# Patient Record
Sex: Male | Born: 1985 | Race: White | Hispanic: No | Marital: Married | Smoking: Never smoker
Health system: Southern US, Community
[De-identification: ages and names within clinical notes are randomized; demographics above are authoritative.]

## PROBLEM LIST (undated history)

## (undated) DIAGNOSIS — N200 Calculus of kidney: Secondary | ICD-10-CM

## (undated) HISTORY — PX: OTHER SURGICAL HISTORY: SHX169

## (undated) HISTORY — DX: Calculus of kidney: N20.0

---
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
## 2006-02-05 ENCOUNTER — Emergency Department (HOSPITAL_COMMUNITY): Admission: EM | Admit: 2006-02-05 | Discharge: 2006-02-05 | Payer: Self-pay | Admitting: Emergency Medicine

## 2012-08-23 ENCOUNTER — Emergency Department (HOSPITAL_COMMUNITY): Payer: BC Managed Care – PPO

## 2012-08-23 ENCOUNTER — Emergency Department (HOSPITAL_COMMUNITY)
Admission: EM | Admit: 2012-08-23 | Discharge: 2012-08-23 | Disposition: A | Discharge status: Home / Self Care | Payer: BC Managed Care – PPO | Attending: Emergency Medicine | Admitting: Emergency Medicine

## 2012-08-23 ENCOUNTER — Encounter (HOSPITAL_COMMUNITY): Payer: Self-pay | Admitting: Emergency Medicine

## 2012-08-23 DIAGNOSIS — N23 Unspecified renal colic: Secondary | ICD-10-CM

## 2012-08-23 LAB — CBC WITH DIFFERENTIAL/PLATELET
Basophils Absolute: 0 10*3/uL (ref 0.0–0.1)
Basophils Relative: 1 % (ref 0–1)
Eosinophils Absolute: 0.1 10*3/uL (ref 0.0–0.7)
Eosinophils Relative: 3 % (ref 0–5)
HCT: 43.4 % (ref 39.0–52.0)
Hemoglobin: 15.4 g/dL (ref 13.0–17.0)
Lymphocytes Relative: 35 % (ref 12–46)
Lymphs Abs: 1.6 10*3/uL (ref 0.7–4.0)
MCH: 30.9 pg (ref 26.0–34.0)
MCHC: 35.5 g/dL (ref 30.0–36.0)
MCV: 87 fL (ref 78.0–100.0)
Monocytes Absolute: 0.4 10*3/uL (ref 0.1–1.0)
Monocytes Relative: 8 % (ref 3–12)
Neutro Abs: 2.4 10*3/uL (ref 1.7–7.7)
Neutrophils Relative %: 53 % (ref 43–77)
Platelets: 125 10*3/uL — ABNORMAL LOW (ref 150–400)
RBC: 4.99 MIL/uL (ref 4.22–5.81)
RDW: 12.5 % (ref 11.5–15.5)
WBC: 4.4 10*3/uL (ref 4.0–10.5)

## 2012-08-23 LAB — URINALYSIS, ROUTINE W REFLEX MICROSCOPIC
Bilirubin Urine: NEGATIVE
Glucose, UA: NEGATIVE mg/dL
Ketones, ur: NEGATIVE mg/dL
Leukocytes, UA: NEGATIVE
Nitrite: NEGATIVE
Protein, ur: NEGATIVE mg/dL
Specific Gravity, Urine: 1.012 (ref 1.005–1.030)
Urobilinogen, UA: 0.2 mg/dL (ref 0.0–1.0)
pH: 8 (ref 5.0–8.0)

## 2012-08-23 LAB — BASIC METABOLIC PANEL
BUN: 11 mg/dL (ref 6–23)
CO2: 30 mEq/L (ref 19–32)
Calcium: 9.7 mg/dL (ref 8.4–10.5)
Chloride: 103 mEq/L (ref 96–112)
Creatinine, Ser: 0.94 mg/dL (ref 0.50–1.35)
GFR calc Af Amer: >90 mL/min (ref >90)
GFR calc non Af Amer: >90 mL/min (ref >90)
Glucose, Bld: 96 mg/dL (ref 70–99)
Potassium: 3.9 mEq/L (ref 3.5–5.1)
Sodium: 141 mEq/L (ref 135–145)

## 2012-08-23 LAB — URINE MICROSCOPIC-ADD ON

## 2012-08-23 MED ORDER — HYDROMORPHONE HCL PF 1 MG/ML IJ SOLN
1.0000 mg | Freq: Once | INTRAMUSCULAR | Status: AC
  Administered 2012-08-23: 1 mg via INTRAMUSCULAR
  Filled 2012-08-23: qty 1

## 2012-08-23 NOTE — ED Notes (Signed)
Pt to CT

## 2012-08-23 NOTE — ED Notes (Signed)
NAD noted at time of d/c home with mother 

## 2012-08-23 NOTE — ED Provider Notes (Signed)
History     CSN: [id number]  Arrival date & time 08/23/12  0920   First MD Initiated Contact with Patient 08/23/12 1042      Chief Complaint  Patient presents with  . Abdominal Pain    (Consider location/radiation/quality/duration/timing/severity/associated sxs/prior treatment) Patient is a 26 y.o. male presenting with abdominal pain. The history is provided by the patient.  Abdominal Pain The primary symptoms of the illness include abdominal pain. The primary symptoms of the illness do not include fever or shortness of breath.  Additional symptoms associated with the illness include back pain. Symptoms associated with the illness do not include hematuria.  pt c/o acute onset right flank pain posteriorly from sleep while resting this morning. Acute, sudden severe pain, radiated to rlq. Constant but waxes/wanes in intensity. Nausea. No vomiting. Having normal bms. No hx same pain. No back injury or strain. No hx kidney stones. No dysuria or hematuria. No scrotal or testicular pain or swelling. No fever or chills.   History reviewed. No pertinent past medical history.  History reviewed. No pertinent past surgical history.  History reviewed. No pertinent family history.  History  Substance Use Topics  . Smoking status: Never Smoker   . Smokeless tobacco: Not on file  . Alcohol Use: Yes     occ      Review of Systems  Constitutional: Negative for fever.  HENT: Negative for neck pain.   Respiratory: Negative for cough and shortness of breath.   Cardiovascular: Negative for chest pain.  Gastrointestinal: Positive for abdominal pain.  Genitourinary: Positive for flank pain. Negative for hematuria.  Musculoskeletal: Positive for back pain.  Skin: Negative for rash.  Neurological: Negative for headaches.  Hematological: Does not bruise/bleed easily.  Psychiatric/Behavioral: Negative for confusion.    Allergies  Review of patient's allergies indicates no known  allergies.  Home Medications  No current outpatient prescriptions on file.  BP 132/82  Pulse 88  Temp 97.8 F (36.6 C) (Oral)  Resp 20  SpO2 99%  Physical Exam  Nursing note and vitals reviewed. Constitutional: He is oriented to person, place, and time. He appears well-developed and well-nourished. No distress.  HENT:  Head: Atraumatic.  Eyes: Conjunctivae normal are normal. No scleral icterus.  Neck: Neck supple. No tracheal deviation present.  Cardiovascular: Normal rate, regular rhythm, normal heart sounds and intact distal pulses.   Pulmonary/Chest: Effort normal and breath sounds normal. No accessory muscle usage. No respiratory distress.  Abdominal: Soft. Bowel sounds are normal. He exhibits no distension and no mass. There is no tenderness. There is no rebound and no guarding.  Genitourinary:       No cva tenderness. No scrotal/testicular pain or tenderness  Musculoskeletal: Normal range of motion. He exhibits no edema and no tenderness.       tls spine nt.   Neurological: He is alert and oriented to person, place, and time.  Skin: Skin is warm and dry. No rash noted.       No rash/shingles in area of pain  Psychiatric: He has a normal mood and affect.    ED Course  Procedures (including critical care time)  Labs Reviewed  CBC WITH DIFFERENTIAL - Abnormal; Notable for the following:    Platelets 125 (*)     All other components within normal limits  BASIC METABOLIC PANEL  URINALYSIS, ROUTINE W REFLEX MICROSCOPIC   Results for orders placed during the hospital encounter of 08/23/12  CBC WITH DIFFERENTIAL      Component  Value Range   WBC 4.4  4.0 - 10.5 K/uL   RBC 4.99  4.22 - 5.81 MIL/uL   Hemoglobin 15.4  13.0 - 17.0 g/dL   HCT 16.1  09.6 - 04.5 %   MCV 87.0  78.0 - 100.0 fL   MCH 30.9  26.0 - 34.0 pg   MCHC 35.5  30.0 - 36.0 g/dL   RDW 40.9  81.1 - 91.4 %   Platelets 125 (*) 150 - 400 K/uL   Neutrophils Relative 53  43 - 77 %   Neutro Abs 2.4  1.7 - 7.7  K/uL   Lymphocytes Relative 35  12 - 46 %   Lymphs Abs 1.6  0.7 - 4.0 K/uL   Monocytes Relative 8  3 - 12 %   Monocytes Absolute 0.4  0.1 - 1.0 K/uL   Eosinophils Relative 3  0 - 5 %   Eosinophils Absolute 0.1  0.0 - 0.7 K/uL   Basophils Relative 1  0 - 1 %   Basophils Absolute 0.0  0.0 - 0.1 K/uL  BASIC METABOLIC PANEL      Component Value Range   Sodium 141  135 - 145 mEq/L   Potassium 3.9  3.5 - 5.1 mEq/L   Chloride 103  96 - 112 mEq/L   CO2 30  19 - 32 mEq/L   Glucose, Bld 96  70 - 99 mg/dL   BUN 11  6 - 23 mg/dL   Creatinine, Ser 7.82  0.50 - 1.35 mg/dL   Calcium 9.7  8.4 - 95.6 mg/dL   GFR calc non Af Amer >90  >90 mL/min   GFR calc Af Amer >90  >90 mL/min  URINALYSIS, ROUTINE W REFLEX MICROSCOPIC      Component Value Range   Color, Urine YELLOW  YELLOW   APPearance CLEAR  CLEAR   Specific Gravity, Urine 1.012  1.005 - 1.030   pH 8.0  5.0 - 8.0   Glucose, UA NEGATIVE  NEGATIVE mg/dL   Hgb urine dipstick MODERATE (*) NEGATIVE   Bilirubin Urine NEGATIVE  NEGATIVE   Ketones, ur NEGATIVE  NEGATIVE mg/dL   Protein, ur NEGATIVE  NEGATIVE mg/dL   Urobilinogen, UA 0.2  0.0 - 1.0 mg/dL   Nitrite NEGATIVE  NEGATIVE   Leukocytes, UA NEGATIVE  NEGATIVE  URINE MICROSCOPIC-ADD ON      Component Value Range   RBC / HPF 21-50  <3 RBC/hpf   Urine-Other AMORPHOUS URATES/PHOSPHATES     Ct Abdomen Pelvis Wo Contrast  08/23/2012  *RADIOLOGY REPORT*  Clinical Data: Right flank pain  CT ABDOMEN AND PELVIS WITHOUT CONTRAST  Technique:  Multidetector CT imaging of the abdomen and pelvis was performed following the standard protocol without intravenous contrast.  Comparison: None.  Findings: Lung bases are unremarkable. Sagittal images of the spine shows a Schmorl's node deformity superior endplate of the L4 and L5 vertebral body.  Unenhanced liver is unremarkable.  No calcified gallstones are noted within gallbladder.  Pancreas, spleen and adrenal glands are unremarkable.  Kidneys are  symmetrical in size.  No hydronephrosis. Minimal prominence of the right ureter without frank hydroureter. At least three nonobstructive punctate calcified calculi are noted within the right kidney the largest in mid pole measures 1.6 mm. Nonobstructive punctate calcified calculus in the lower pole of the left kidney measures 1.3 mm.  No calcified ureteral calculi are noted bilaterally.  Bilateral distal ureter is unremarkable.  Moderate stool noted in the right colon.  Stool noted  within cecum. There is a low-lying cecum with tip in the right anterior pelvis just above the urinary bladder.  The appendix is not identified.  In axial image 64 in midline posterior aspect of the urinary bladder there is a calcified calculus measures 3 mm.  This most likely represents a recent passed ureteral calculus.  Trace pelvic free fluid is noted in the right posterior cul-de-sac.  Prostate gland and seminal vesicles are unremarkable.  IMPRESSION:  1.  There is bilateral nonobstructive nephrolithiasis.  No hydronephrosis or hydroureter.  Minimal prominence of the right ureter without hydroureter. 2.  No calcified ureteral calculi are noted. 3.  There is a 3 mm calcified calculus midline posterior aspect of the urinary bladder probable a recent passed ureteral calculus. 4.  A low-lying cecum is noted with tip in the right anterior pelvis just above the urinary bladder.  Stool noted in the cecum and right colon.  The appendix is not identified. 5.  Trace pelvic free fluid.   Original Report Authenticated By: Natasha Mead, M.D.       MDM  Dilaudid im.   Labs. Ct.   Microscopic hemauria, ct scan c/w recent passed ureteral stone now in bladder, acute onset flank pain posterior - appears c/w ureteral colic. abd is soft nt. Pain resolved.           Suzi Roots, MD 08/23/12 1316

## 2012-08-23 NOTE — ED Notes (Signed)
Pt c/o RLQ pain that woke pt from sleep this am with nausea

## 2014-04-05 ENCOUNTER — Ambulatory Visit (INDEPENDENT_AMBULATORY_CARE_PROVIDER_SITE_OTHER): Payer: BC Managed Care – PPO | Admitting: Physician Assistant

## 2014-04-05 VITALS — BP 108/68 | HR 109 | Temp 97.7°F | Resp 18 | Ht 71.5 in | Wt 145.0 lb

## 2014-04-05 DIAGNOSIS — J020 Streptococcal pharyngitis: Secondary | ICD-10-CM

## 2014-04-05 DIAGNOSIS — J029 Acute pharyngitis, unspecified: Secondary | ICD-10-CM

## 2014-04-05 DIAGNOSIS — R509 Fever, unspecified: Secondary | ICD-10-CM

## 2014-04-05 LAB — POCT RAPID STREP A (OFFICE): Rapid Strep A Screen: POSITIVE — AB

## 2014-04-05 MED ORDER — AMOXICILLIN 875 MG PO TABS: 875.0000 mg | ORAL_TABLET | Freq: Two times a day (BID) | ORAL | Status: DC

## 2014-04-05 NOTE — Progress Notes (Signed)
   Subjective:    Patient ID: Craig Gutierrez, male    DOB: 10/30/1986, 28 y.o.   MRN: [id number]  HPI 28 year old male presents for evaluation of sore throat x 3 days.  States symptoms started suddenly and have continued to worsen. Admits to severe sore throat with painful swallowing. Slight PND and bilateral otalgia.  Had low grade fever of 100.0 yesterday that has improved today, but all other symptoms continue to worsen.  Has been able to swallow but it is painful.   Hx of strep throat as a child.  Works as an [employer] information [employer] manager where there have been several cases of strep over the past 1-2 weeks.  Patient is otherwise healthy with no other concerns today.     Review of Systems  Constitutional: Positive for fever and chills.  HENT: Positive for ear pain, postnasal drip and sore throat. Negative for congestion, rhinorrhea and sinus pressure.   Respiratory: Negative for cough.   Gastrointestinal: Negative for nausea and vomiting.  Neurological: Positive for headaches. Negative for dizziness.       Objective:   Physical Exam  Constitutional: He is oriented to person, place, and time. He appears well-developed and well-nourished.  HENT:  Head: Normocephalic and atraumatic.  Right Ear: Hearing, tympanic membrane, external ear and ear canal normal.  Left Ear: Hearing, tympanic membrane, external ear and ear canal normal.  Mouth/Throat: Uvula is midline and mucous membranes are normal. Posterior oropharyngeal erythema (bilateral 3+ tonsillar swelling) present. No oropharyngeal exudate, posterior oropharyngeal edema or tonsillar abscesses.  Eyes: Conjunctivae are normal.  Neck: Normal range of motion.  Cardiovascular: Normal rate, regular rhythm and normal heart sounds.   Pulmonary/Chest: Effort normal and breath sounds normal.  Neurological: He is alert and oriented to person, place, and time.  Psychiatric: He has a normal mood and affect. His behavior is normal. Judgment and thought  content normal.      Results for orders placed in visit on 04/05/14  POCT RAPID STREP A (OFFICE)      Result Value Ref Range   Rapid Strep A Screen Positive (*) Negative       Assessment & Plan:  Strep pharyngitis - Plan: amoxicillin (AMOXIL) 875 MG tablet  Acute pharyngitis - Plan: POCT rapid strep A  Fever, unspecified  Will treat with amoxicillin 875 mg bid x 10 days Out of work tomorrow.  Change toothbrush Recommend ibuprofen 600-800 mg tid prn pain, fever/chills RTC precautions discussed Recheck if symptoms worsen or fail to improve.

## 2017-11-30 ENCOUNTER — Other Ambulatory Visit: Payer: Self-pay

## 2017-11-30 ENCOUNTER — Encounter: Payer: Self-pay | Admitting: Emergency Medicine

## 2017-11-30 ENCOUNTER — Ambulatory Visit: Payer: BLUE CROSS/BLUE SHIELD | Admitting: Emergency Medicine

## 2017-11-30 VITALS — BP 102/64 | HR 89 | Temp 98.5°F | Resp 16 | Ht 71.25 in | Wt 157.4 lb

## 2017-11-30 DIAGNOSIS — R12 Heartburn: Secondary | ICD-10-CM | POA: Diagnosis not present

## 2017-11-30 DIAGNOSIS — R1013 Epigastric pain: Secondary | ICD-10-CM

## 2017-11-30 MED ORDER — RANITIDINE HCL 300 MG PO TABS: 300.0000 mg | ORAL_TABLET | Freq: Every day | ORAL | 1 refills | Status: DC

## 2017-11-30 MED ORDER — OMEPRAZOLE 40 MG PO CPDR: 40.0000 mg | DELAYED_RELEASE_CAPSULE | Freq: Every day | ORAL | 3 refills | Status: DC

## 2017-11-30 NOTE — Patient Instructions (Addendum)
IF you received an x-ray today, you will receive an invoice from [facility]. Please contact [facility] at [phone] with questions or concerns regarding your invoice.   IF you received labwork today, you will receive an invoice from [facility]. Please contact [facility] at [phone] with questions or concerns regarding your invoice.   Our billing staff will not be able to assist you with questions regarding bills from these companies.  You will be contacted with the lab results as soon as they are available. The fastest way to get your results is to activate your My Chart account. Instructions are located on the last page of this paperwork. If you have not heard from us regarding the results in 2 weeks, please contact this office.     Heartburn Heartburn is a type of pain or discomfort that can happen in the throat or chest. It is often described as a burning pain. It may also cause a bad taste in the mouth. Heartburn may feel worse when you lie down or bend over. It may be caused by stomach contents that move back up (reflux) into the tube that connects the mouth with the stomach (esophagus). Follow these instructions at home: Take these actions to lessen your discomfort and to help avoid problems. Diet  Follow a diet as told by your doctor. You may need to avoid foods and drinks such as: ? Coffee and tea (with or without caffeine). ? Drinks that contain alcohol. ? Energy drinks and sports drinks. ? Carbonated drinks or sodas. ? Chocolate and cocoa. ? Peppermint and mint flavorings. ? Garlic and onions. ? Horseradish. ? Spicy and acidic foods, such as peppers, chili powder, curry powder, vinegar, hot sauces, and BBQ sauce. ? Citrus fruit juices and citrus fruits, such as oranges, lemons, and limes. ? Tomato-based foods, such as red sauce, chili, salsa, and pizza with red sauce. ? Fried and fatty foods, such as donuts, french fries, potato chips, and high-fat  dressings. ? High-fat meats, such as hot dogs, rib eye steak, sausage, ham, and bacon. ? High-fat dairy items, such as whole milk, butter, and cream cheese.  Eat small meals often. Avoid eating large meals.  Avoid drinking large amounts of liquid with your meals.  Avoid eating meals during the 2-3 hours before bedtime.  Avoid lying down right after you eat.  Do not exercise right after you eat. General instructions  Pay attention to any changes in your symptoms.  Take over-the-counter and prescription medicines only as told by your doctor. Do not take aspirin, ibuprofen, or other NSAIDs unless your doctor says it is okay.  Do not use any tobacco products, including cigarettes, chewing tobacco, and e-cigarettes. If you need help quitting, ask your doctor.  Wear loose clothes. Do not wear anything tight around your waist.  Raise (elevate) the head of your bed about 6 inches (15 cm).  Try to lower your stress. If you need help doing this, ask your doctor.  If you are overweight, lose an amount of weight that is healthy for you. Ask your doctor about a safe weight loss goal.  Keep all follow-up visits as told by your doctor. This is important. Contact a doctor if:  You have new symptoms.  You lose weight and you do not know why it is happening.  You have trouble swallowing, or it hurts to swallow.  You have wheezing or a cough that keeps happening.  Your symptoms do not get better with treatment.  You have heartburn often for more than two weeks. Get help right away if:  You have pain in your arms, neck, jaw, teeth, or back.  You feel sweaty, dizzy, or light-headed.  You have chest pain or shortness of breath.  You throw up (vomit) and your throw up looks like blood or coffee grounds.  Your poop (stool) is bloody or black. This information is not intended to replace advice given to you by your health care provider. Make sure you discuss any questions you have with  your health care provider. Document Released: 07/04/2011 Document Revised: 03/29/2016 Document Reviewed: 02/16/2015 Elsevier Interactive Patient Education  [employer]2018 [employer]

## 2017-11-30 NOTE — Progress Notes (Signed)
Craig Gutierrez 32 y.o.   Chief Complaint  Patient presents with  . Establish Care  . GI Problem    x 2 months - per patient having stomach cramps    HISTORY OF PRESENT ILLNESS: This is a 32 y.o. male complaining of epigastric pain that started 2 months; burning pain that starts in early evening and lasts until bedtime; tried modifying diet without success; gets the pain 4-5 days/week; no associated symptoms.   Abdominal Pain  This is a new problem. The current episode started more than 1 month ago. The onset quality is gradual. The problem occurs intermittently. The problem has been waxing and waning. The pain is located in the epigastric region. The pain is at a severity of 5/10. The pain is moderate. The quality of the pain is burning. The abdominal pain does not radiate. Pertinent negatives include no anorexia, belching, constipation, diarrhea, dysuria, fever, frequency, headaches, hematochezia, hematuria, melena, myalgias, nausea, vomiting or weight loss. Nothing aggravates the pain. The pain is relieved by nothing. He has tried nothing for the symptoms.     Prior to Admission medications   Medication Sig Start Date End Date Taking? Authorizing Provider  FAMOTIDINE PO Take by mouth as needed.   Yes [provider]  ibuprofen (ADVIL,MOTRIN) 600 MG tablet Take 600 mg by mouth as needed.   Yes [provider]  amoxicillin (AMOXIL) 875 MG tablet Take 1 tablet (875 mg total) by mouth 2 (two) times daily. 04/05/14   Nelva Nay, PA-C    No Known Allergies  There are no active problems to display for this patient.   No past medical history on file.  Past Surgical History:  Procedure Laterality Date  . clubfoot Left    infant    Social History   Socioeconomic History  . Marital status: Married    Spouse name: Not on file  . Number of children: Not on file  . Years of education: Not on file  . Highest education level: Not on file  Social Needs  .  Financial resource strain: Not on file  . Food insecurity - worry: Not on file  . Food insecurity - inability: Not on file  . Transportation needs - medical: Not on file  . Transportation needs - non-medical: Not on file  Occupational History  . Not on file  Tobacco Use  . Smoking status: Never Smoker  . Smokeless tobacco: Never Used  Substance and Sexual Activity  . Alcohol use: Yes    Comment: occ -wine,beer, liquor  . Drug use: No  . Sexual activity: Not on file  Other Topics Concern  . Not on file  Social History Narrative  . Not on file    Family History  Problem Relation Age of Onset  . Diabetes Mother   . Hyperlipidemia Mother   . Hyperlipidemia Father   . Diabetes Maternal Grandmother   . Hyperlipidemia Maternal Grandmother   . Diabetes Maternal Grandfather   . Hyperlipidemia Maternal Grandfather      Review of Systems  Constitutional: Negative for fever and weight loss.  HENT: Negative.   Eyes: Negative.   Respiratory: Negative.  Negative for cough and shortness of breath.   Cardiovascular: Negative.  Negative for chest pain and palpitations.  Gastrointestinal: Positive for abdominal pain and heartburn. Negative for anorexia, blood in stool, constipation, diarrhea, hematochezia, melena, nausea and vomiting.  Genitourinary: Negative for dysuria, frequency and hematuria.  Musculoskeletal: Negative for myalgias and neck pain.  Skin: Negative.  Negative for rash.  Neurological: Negative.  Negative for dizziness and headaches.  Endo/Heme/Allergies: Negative.   All other systems reviewed and are negative.  Vitals:   11/30/17 1152  BP: 102/64  Pulse: 89  Resp: 16  Temp: 98.5 F (36.9 C)  SpO2: 97%     Physical Exam  Constitutional: He is oriented to person, place, and time. He appears well-developed and well-nourished.  HENT:  Head: Normocephalic and atraumatic.  Nose: Nose normal.  Mouth/Throat: Oropharynx is clear and moist.  Eyes: Conjunctivae and  EOM are normal. Pupils are equal, round, and reactive to light.  Neck: Normal range of motion. Neck supple. No JVD present.  Cardiovascular: Normal rate, regular rhythm, normal heart sounds and intact distal pulses.  Pulmonary/Chest: Effort normal and breath sounds normal.  Abdominal: Soft. Bowel sounds are normal. He exhibits no distension and no mass. There is tenderness (epigastrum). There is no guarding.  Musculoskeletal: Normal range of motion.  Lymphadenopathy:    He has no cervical adenopathy.  Neurological: He is alert and oriented to person, place, and time. No sensory deficit. He exhibits normal muscle tone.  Skin: Skin is warm and dry. Capillary refill takes less than 2 seconds. No rash noted.  Psychiatric: He has a normal mood and affect. His behavior is normal.  Vitals reviewed.    ASSESSMENT & PLAN: Barbara CowerJason was seen today for establish care and gi problem.  Diagnoses and all orders for this visit:  Epigastric pain -     H. pylori breath test -     CBC with Differential/Platelet -     Comprehensive metabolic panel -     Lipase -     Ambulatory referral to Gastroenterology -     omeprazole (PRILOSEC) 40 MG capsule; Take 1 capsule (40 mg total) by mouth daily. -     ranitidine (ZANTAC) 300 MG tablet; Take 1 tablet (300 mg total) by mouth at bedtime for 14 days.  Heartburn -     H. pylori breath test -     CBC with Differential/Platelet -     Comprehensive metabolic panel -     Lipase -     Ambulatory referral to Gastroenterology -     omeprazole (PRILOSEC) 40 MG capsule; Take 1 capsule (40 mg total) by mouth daily. -     ranitidine (ZANTAC) 300 MG tablet; Take 1 tablet (300 mg total) by mouth at bedtime for 14 days.    Patient Instructions       IF you received an x-ray today, you will receive an invoice from [facility]. Please contact [facility] at [phone] with questions or concerns regarding your invoice.   IF you received labwork  today, you will receive an invoice from [facility]. Please contact [facility] at [phone] with questions or concerns regarding your invoice.   Our billing staff will not be able to assist you with questions regarding bills from these companies.  You will be contacted with the lab results as soon as they are available. The fastest way to get your results is to activate your My Chart account. Instructions are located on the last page of this paperwork. If you have not heard from us regarding the results in 2 weeks, please contact this office.     Heartburn Heartburn is a type of pain or discomfort that can happen in the throat or chest. It is often described as a burning pain. It may also cause a bad taste in the mouth. Heartburn may  feel worse when you lie down or bend over. It may be caused by stomach contents that move back up (reflux) into the tube that connects the mouth with the stomach (esophagus). Follow these instructions at home: Take these actions to lessen your discomfort and to help avoid problems. Diet  Follow a diet as told by your doctor. You may need to avoid foods and drinks such as: ? Coffee and tea (with or without caffeine). ? Drinks that contain alcohol. ? Energy drinks and sports drinks. ? Carbonated drinks or sodas. ? Chocolate and cocoa. ? Peppermint and mint flavorings. ? Garlic and onions. ? Horseradish. ? Spicy and acidic foods, such as peppers, chili powder, curry powder, vinegar, hot sauces, and BBQ sauce. ? Citrus fruit juices and citrus fruits, such as oranges, lemons, and limes. ? Tomato-based foods, such as red sauce, chili, salsa, and pizza with red sauce. ? Fried and fatty foods, such as donuts, french fries, potato chips, and high-fat dressings. ? High-fat meats, such as hot dogs, rib eye steak, sausage, ham, and bacon. ? High-fat dairy items, such as whole milk, butter, and cream cheese.  Eat small meals often. Avoid eating large meals.  Avoid  drinking large amounts of liquid with your meals.  Avoid eating meals during the 2-3 hours before bedtime.  Avoid lying down right after you eat.  Do not exercise right after you eat. General instructions  Pay attention to any changes in your symptoms.  Take over-the-counter and prescription medicines only as told by your doctor. Do not take aspirin, ibuprofen, or other NSAIDs unless your doctor says it is okay.  Do not use any tobacco products, including cigarettes, chewing tobacco, and e-cigarettes. If you need help quitting, ask your doctor.  Wear loose clothes. Do not wear anything tight around your waist.  Raise (elevate) the head of your bed about 6 inches (15 cm).  Try to lower your stress. If you need help doing this, ask your doctor.  If you are overweight, lose an amount of weight that is healthy for you. Ask your doctor about a safe weight loss goal.  Keep all follow-up visits as told by your doctor. This is important. Contact a doctor if:  You have new symptoms.  You lose weight and you do not know why it is happening.  You have trouble swallowing, or it hurts to swallow.  You have wheezing or a cough that keeps happening.  Your symptoms do not get better with treatment.  You have heartburn often for more than two weeks. Get help right away if:  You have pain in your arms, neck, jaw, teeth, or back.  You feel sweaty, dizzy, or light-headed.  You have chest pain or shortness of breath.  You throw up (vomit) and your throw up looks like blood or coffee grounds.  Your poop (stool) is bloody or black. This information is not intended to replace advice given to you by your health care provider. Make sure you discuss any questions you have with your health care provider. Document Released: 07/04/2011 Document Revised: 03/29/2016 Document Reviewed: 02/16/2015 Elsevier Interactive Patient Education  2018 [employer].      Edwina Barth, MD [facility]

## 2017-12-01 LAB — COMPREHENSIVE METABOLIC PANEL
A/G RATIO: 1.9 (ref 1.2–2.2)
ALBUMIN: 4.9 g/dL (ref 3.5–5.5)
ALT: 12 IU/L (ref 0–44)
AST: 16 IU/L (ref 0–40)
Alkaline Phosphatase: 50 IU/L (ref 39–117)
BILIRUBIN TOTAL: 0.7 mg/dL (ref 0.0–1.2)
BUN / CREAT RATIO: 13 (ref 9–20)
BUN: 11 mg/dL (ref 6–20)
CALCIUM: 9.7 mg/dL (ref 8.7–10.2)
CHLORIDE: 102 mmol/L (ref 96–106)
CO2: 23 mmol/L (ref 20–29)
Creatinine, Ser: 0.88 mg/dL (ref 0.76–1.27)
GFR, EST AFRICAN AMERICAN: 131 mL/min/{1.73_m2} (ref >59)
GFR, EST NON AFRICAN AMERICAN: 114 mL/min/{1.73_m2} (ref >59)
Globulin, Total: 2.6 g/dL (ref 1.5–4.5)
Glucose: 77 mg/dL (ref 65–99)
Potassium: 4.3 mmol/L (ref 3.5–5.2)
Sodium: 141 mmol/L (ref 134–144)
TOTAL PROTEIN: 7.5 g/dL (ref 6.0–8.5)

## 2017-12-01 LAB — CBC WITH DIFFERENTIAL/PLATELET
Basophils Absolute: 0 10*3/uL (ref 0.0–0.2)
Basos: 1 %
EOS (ABSOLUTE): 0.1 10*3/uL (ref 0.0–0.4)
EOS: 1 %
HEMOGLOBIN: 16.2 g/dL (ref 13.0–17.7)
Hematocrit: 44.2 % (ref 37.5–51.0)
IMMATURE GRANS (ABS): 0 10*3/uL (ref 0.0–0.1)
Immature Granulocytes: 0 %
LYMPHS ABS: 1.5 10*3/uL (ref 0.7–3.1)
LYMPHS: 23 %
MCH: 31 pg (ref 26.6–33.0)
MCHC: 36.7 g/dL — ABNORMAL HIGH (ref 31.5–35.7)
MCV: 85 fL (ref 79–97)
MONOCYTES: 5 %
Monocytes Absolute: 0.4 10*3/uL (ref 0.1–0.9)
NEUTROS ABS: 4.6 10*3/uL (ref 1.4–7.0)
Neutrophils: 70 %
Platelets: 187 10*3/uL (ref 150–379)
RBC: 5.22 x10E6/uL (ref 4.14–5.80)
RDW: 12.8 % (ref 12.3–15.4)
WBC: 6.6 10*3/uL (ref 3.4–10.8)

## 2017-12-01 LAB — LIPASE: Lipase: 20 U/L (ref 13–78)

## 2017-12-01 LAB — H. PYLORI BREATH TEST: H pylori Breath Test: NEGATIVE

## 2017-12-03 ENCOUNTER — Encounter: Payer: Self-pay | Admitting: Radiology

## 2017-12-04 ENCOUNTER — Encounter: Payer: Self-pay | Admitting: Physician Assistant

## 2017-12-17 ENCOUNTER — Ambulatory Visit (INDEPENDENT_AMBULATORY_CARE_PROVIDER_SITE_OTHER): Payer: BLUE CROSS/BLUE SHIELD | Admitting: Physician Assistant

## 2017-12-17 ENCOUNTER — Encounter: Payer: Self-pay | Admitting: Physician Assistant

## 2017-12-17 VITALS — BP 102/60 | HR 76 | Ht 71.25 in | Wt 159.0 lb

## 2017-12-17 DIAGNOSIS — R1013 Epigastric pain: Secondary | ICD-10-CM | POA: Diagnosis not present

## 2017-12-17 MED ORDER — RANITIDINE HCL 300 MG PO TABS: 300.0000 mg | ORAL_TABLET | Freq: Every day | ORAL | 3 refills | Status: AC

## 2017-12-17 MED ORDER — OMEPRAZOLE 40 MG PO CPDR: DELAYED_RELEASE_CAPSULE | ORAL | 3 refills | Status: AC

## 2017-12-17 NOTE — Progress Notes (Signed)
I agree with the above note, plan 

## 2017-12-17 NOTE — Patient Instructions (Signed)
You have been scheduled for an abdominal ultrasound at [facility] (1st floor of hospital) on Friday 2-15 at 9:00 am. Please arrive at 8:45 am  to your appointment for registration. Make certain not to have anything to eat or drink after midnight to your appointment. Should you need to reschedule your appointment, please contact radiology at [phone]. This test typically takes about 30 minutes to perform.   You have been scheduled for an endoscopy. Please follow written instructions given to you at your visit today. If you use inhalers (even only as needed), please bring them with you on the day of your procedure. Your physician has requested that you go to www.startemmi.com and enter the access code given to you at your visit today. This web site gives a general overview about your procedure. However, you should still follow specific instructions given to you by our office regarding your preparation for the procedure.  If you are age 33 or younger, your body mass index should be between 19-25. Your Body mass index is 22.02 kg/m. If this is out of the aformentioned range listed, please consider follow up with your Primary Care Provider.

## 2017-12-17 NOTE — Progress Notes (Signed)
Subjective:    Patient ID: Craig Gutierrez, male    DOB: 07/07/1986, 32 y.o.   MRN: [id number]  HPI Craig Gutierrez is a pleasant 32 year old white male, new to GI today referred by Dr. Alvy Gutierrez for evaluation of upper abdominal pain. Patient is generally in good health and has no known chronic medical problems. He has not had any prior GI evaluation. He says he eats current pain started about 2 months ago and initially was intense and burning in nature and present on a daily basis though not necessarily constantly. He has not had any associated nausea or vomiting. Appetite has been okay and weight has been stable. He says he may be down a couple of pounds. No associated fever or chills, no changes in bowel habits diarrhea melena or hematochezia. He has not had any associated heartburn indigestion or dysphagia. He had been using ibuprofen on an as-needed basis for ankle pain and had not been taking an excessive amount or necessarily using a daily.  He  was seen by primary care on 11/24/2017 and started on Prilosec 40 mg by mouth every morning and Zantac 300 milligrams at bedtime. H. pylori breath testing was done and negative ,and baseline labs with CBC, chemistries and lipase all within normal limits. Over the past few weeks on medication he says he is no longer having intense pain but now has more of a dull constant discomfort. He says pain previously was more of a grinding ,burning pain in the epigastrium without radiation and now it's more dull and constant. No changes with by mouth intake and not as intense as previous. He has stopped all NSAID use. He does not drink alcohol on a regular basis. History pertinent for gallbladder disease in his sister and a paternal grandmother with colon cancer diagnosed in her late 6950s  Review of Systems Pertinent positive and negative review of systems were noted in the above HPI section.  All other review of systems was otherwise negative.  Outpatient Encounter Medications  as of 12/17/2017  Medication Sig  . omeprazole (PRILOSEC) 40 MG capsule Take 1 capsule every morning.  . ranitidine (ZANTAC) 300 MG tablet Take 1 tablet (300 mg total) by mouth at bedtime.  . [DISCONTINUED] omeprazole (PRILOSEC) 40 MG capsule Take 1 capsule (40 mg total) by mouth daily.  . [DISCONTINUED] ranitidine (ZANTAC) 300 MG tablet Take 300 mg by mouth at bedtime.  . [DISCONTINUED] amoxicillin (AMOXIL) 875 MG tablet Take 1 tablet (875 mg total) by mouth 2 (two) times daily.  . [DISCONTINUED] FAMOTIDINE PO Take by mouth as needed.  . [DISCONTINUED] ibuprofen (ADVIL,MOTRIN) 600 MG tablet Take 600 mg by mouth as needed.  . [DISCONTINUED] ranitidine (ZANTAC) 300 MG tablet Take 1 tablet (300 mg total) by mouth at bedtime for 14 days.   No facility-administered encounter medications on file as of 12/17/2017.    No Known Allergies Patient Active Problem List   Diagnosis Date Noted  . Epigastric pain 11/30/2017  . Heartburn 11/30/2017   Social History   Socioeconomic History  . Marital status: Married    Spouse name: Craig Gutierrez  . Number of children: 1  . Years of education: Not on file  . Highest education level: Not on file  Social Needs  . Financial resource strain: Not on file  . Food insecurity - worry: Not on file  . Food insecurity - inability: Not on file  . Transportation needs - medical: Not on file  . Transportation needs - non-medical: Not on file  Occupational History  . Occupation: Agricultural consultant  Tobacco Use  . Smoking status: Never Smoker  . Smokeless tobacco: Never Used  Substance and Sexual Activity  . Alcohol use: Yes    Comment: occ -wine,beer, liquor  . Drug use: No  . Sexual activity: Not on file  Other Topics Concern  . Not on file  Social History Narrative  . Not on file    Mr. Craig Gutierrez family history includes Colon cancer in his paternal grandmother; Diabetes in his maternal grandfather, maternal grandmother, and mother; Heart disease in his  maternal grandfather; Hyperlipidemia in his father, maternal grandfather, maternal grandmother, and mother; Irritable bowel syndrome in his mother.      Objective:    Vitals:   12/17/17 0930  BP: 102/60  Pulse: 76    Physical Exam ; well-developed young white male in no acute distress, pleasant blood pressure 102/60 pulse 76 height 511, weight 159, BMI of 22.0. HEENT; nontraumatic normocephalic EOMI PERRLA sclera anicteric, Cardiovascular; regular rate and rhythm with S1-S2 no murmur or gallop, Pulmonary; clear bilaterally, Abdomen; soft, minimally tender in the epigastrium there is no palpable mass or hepatosplenomegaly bowel sounds are present, Rectal ;exam not done, Ext; no clubbing cyanosis or edema skin warm and dry, Neuropsych; mood and affect appropriate       Assessment & Plan:   #73 32 year old white male with 2 month history of persistent epigastric pain, initially described as intense and burning and now constant and dull after being on omeprazole and ranitidine over the past few weeks. Etiology of pain is not increased entirely clear, suspect peptic ulcer disease possibly NSAID-induced or diffuse gastritis, we will rule out gallbladder disease, consider underlying pancreatic disease though previous lipase was normal. H. pylori breath testing recently negative  #2 family history of colon cancer in patient's paternal grandmother diagnosed late 32s  Plan; Continue omeprazole 40 mg by mouth every morning, refills sent Continue ranitidine 300 mg by mouth daily at bedtime, refills sent Avoid all NSAIDs, advise Tylenol on a when necessary basis Schedule for upper abdominal ultrasound Patient will be scheduled for upper endoscopy with Dr. Christella Gutierrez. Procedure was discussed in detail with the patient including indications risks and benefits and he is agreeable to proceed.  Craig S Esterwood PA-C 12/17/2017   Cc: Craig Gutierrez, *

## 2017-12-20 ENCOUNTER — Ambulatory Visit (HOSPITAL_COMMUNITY)
Admission: RE | Admit: 2017-12-20 | Discharge: 2017-12-20 | Disposition: A | Discharge status: Home / Self Care | Payer: BLUE CROSS/BLUE SHIELD | Source: Ambulatory Visit | Attending: Physician Assistant | Admitting: Physician Assistant

## 2017-12-20 ENCOUNTER — Ambulatory Visit (HOSPITAL_COMMUNITY): Admission: RE | Admit: 2017-12-20 | Payer: BLUE CROSS/BLUE SHIELD | Source: Ambulatory Visit

## 2017-12-20 DIAGNOSIS — R1013 Epigastric pain: Secondary | ICD-10-CM | POA: Diagnosis not present

## 2017-12-23 ENCOUNTER — Encounter: Payer: Self-pay | Admitting: Gastroenterology

## 2017-12-23 ENCOUNTER — Ambulatory Visit (AMBULATORY_SURGERY_CENTER): Payer: BLUE CROSS/BLUE SHIELD | Admitting: Gastroenterology

## 2017-12-23 VITALS — BP 117/75 | HR 73 | Temp 99.1°F | Resp 12 | Ht 71.0 in | Wt 159.0 lb

## 2017-12-23 DIAGNOSIS — K209 Esophagitis, unspecified without bleeding: Secondary | ICD-10-CM

## 2017-12-23 DIAGNOSIS — R1013 Epigastric pain: Secondary | ICD-10-CM | POA: Diagnosis present

## 2017-12-23 MED ORDER — SODIUM CHLORIDE 0.9 % IV SOLN: 500.0000 mL | Freq: Once | INTRAVENOUS | Status: AC

## 2017-12-23 NOTE — Progress Notes (Signed)
To recovery, report to RN, VSS. 

## 2017-12-23 NOTE — Progress Notes (Signed)
Pt's states no medical or surgical changes since previsit or office visit. 

## 2017-12-23 NOTE — Op Note (Signed)
[facility] Patient Name: Craig Gutierrez Procedure Date: 12/23/2017 1:20 PM MRN: [id number] Endoscopist: Rachael Fee , MD Age: 32 Referring MD:  Date of Birth: April 08, 1986 Gender: Male Account #: [id number] Procedure:                Upper GI endoscopy Indications:              Epigastric abdominal pain Medicines:                Monitored Anesthesia Care Procedure:                Pre-Anesthesia Assessment:                           - Prior to the procedure, a History and Physical                            was performed, and patient medications and                            allergies were reviewed. The patient's tolerance of                            previous anesthesia was also reviewed. The risks                            and benefits of the procedure and the sedation                            options and risks were discussed with the patient.                            All questions were answered, and informed consent                            was obtained. Prior Anticoagulants: The patient has                            taken no previous anticoagulant or antiplatelet                            agents. ASA Grade Assessment: II - A patient with                            mild systemic disease. After reviewing the risks                            and benefits, the patient was deemed in                            satisfactory condition to undergo the procedure.                           After obtaining informed consent, the endoscope was  passed under direct vision. Throughout the                            procedure, the patient's blood pressure, pulse, and                            oxygen saturations were monitored continuously. The                            Endoscope was introduced through the mouth, and                            advanced to the second part of duodenum. The upper                            GI endoscopy was accomplished  without difficulty.                            The patient tolerated the procedure well. Scope In: Scope Out: Findings:                 LA Grade A (one or more mucosal breaks less than 5                            mm, not extending between tops of 2 mucosal folds)                            esophagitis was found in the distal esophagus.                           The exam was otherwise without abnormality. Complications:            No immediate complications. Estimated blood loss:                            None. Estimated Blood Loss:     Estimated blood loss: none. Impression:               - LA Grade A reflux esophagitis.                           - The examination was otherwise normal.                           - No specimens collected. Recommendation:           - Patient has a contact number available for                            emergencies. The signs and symptoms of potential                            delayed complications were discussed with the                            patient. Return to  normal activities tomorrow.                            Written discharge instructions were provided to the                            patient.                           - Resume previous diet.                           - Continue present medications. OK to stop                            ranitidine but continue once daily omeprazole.                           - Continue to avoid NSAIDs. Rachael Fee, MD 12/23/2017 1:32:34 PM This report has been signed electronically.

## 2017-12-23 NOTE — Patient Instructions (Signed)
   Avoid anti inflammatory products if able,Tylenol ok   Ok to stop Ranitidine but continue Omeprazole ( 30 minutes before a meal)    YOU HAD AN ENDOSCOPIC PROCEDURE TODAY AT THE [facility]:   Refer to the procedure report that was given to you for any specific questions about what was found during the examination.  If the procedure report does not answer your questions, please call your gastroenterologist to clarify.  If you requested that your care partner not be given the details of your procedure findings, then the procedure report has been included in a sealed envelope for you to review at your convenience later.  YOU SHOULD EXPECT: Some feelings of bloating in the abdomen. Passage of more gas than usual.  Walking can help get rid of the air that was put into your GI tract during the procedure and reduce the bloating. If you had a lower endoscopy (such as a colonoscopy or flexible sigmoidoscopy) you may notice spotting of blood in your stool or on the toilet paper. If you underwent a bowel prep for your procedure, you may not have a normal bowel movement for a few days.  Please Note:  You might notice some irritation and congestion in your nose or some drainage.  This is from the oxygen used during your procedure.  There is no need for concern and it should clear up in a day or so.  SYMPTOMS TO REPORT IMMEDIATELY:     Following upper endoscopy (EGD)  Vomiting of blood or coffee ground material  New chest pain or pain under the shoulder blades  Painful or persistently difficult swallowing  New shortness of breath  Fever of 100F or higher  Black, tarry-looking stools  For urgent or emergent issues, a gastroenterologist can be reached at any hour by calling [phone].   DIET:  We do recommend a small meal at first, but then you may proceed to your regular diet.  Drink plenty of fluids but you should avoid alcoholic beverages for 24 hours.  ACTIVITY:  You should  plan to take it easy for the rest of today and you should NOT DRIVE or use heavy machinery until tomorrow (because of the sedation medicines used during the test).    FOLLOW UP: Our staff will call the number listed on your records the next business day following your procedure to check on you and address any questions or concerns that you may have regarding the information given to you following your procedure. If we do not reach you, we will leave a message.  However, if you are feeling well and you are not experiencing any problems, there is no need to return our call.  We will assume that you have returned to your regular daily activities without incident.  If any biopsies were taken you will be contacted by phone or by letter within the next 1-3 weeks.  Please call us at [phone] if you have not heard about the biopsies in 3 weeks.    SIGNATURES/CONFIDENTIALITY: You and/or your care partner have signed paperwork which will be entered into your electronic medical record.  These signatures attest to the fact that that the information above on your After Visit Summary has been reviewed and is understood.  Full responsibility of the confidentiality of this discharge information lies with you and/or your care-partner.

## 2017-12-24 ENCOUNTER — Telehealth: Payer: Self-pay

## 2017-12-24 NOTE — Telephone Encounter (Signed)
  Follow up Call-  Call back number 12/23/2017  Post procedure Call Back phone  # [phone]  Permission to leave phone message Yes  Some recent data might be hidden     Patient questions:  Do you have a fever, pain , or abdominal swelling? No. Pain Score  0 *  Have you tolerated food without any problems? Yes.    Have you been able to return to your normal activities? Yes.    Do you have any questions about your discharge instructions: Diet   No. Medications  No. Follow up visit  No.  Do you have questions or concerns about your Care? No.  Actions: * If pain score is 4 or above: No action needed, pain <4.

## 2018-05-13 ENCOUNTER — Encounter: Payer: Self-pay | Admitting: Emergency Medicine

## 2018-05-13 ENCOUNTER — Other Ambulatory Visit: Payer: Self-pay

## 2018-05-13 ENCOUNTER — Ambulatory Visit: Payer: BLUE CROSS/BLUE SHIELD | Admitting: Emergency Medicine

## 2018-05-13 VITALS — BP 114/72 | HR 98 | Temp 98.2°F | Resp 16 | Ht 71.25 in | Wt 158.6 lb

## 2018-05-13 DIAGNOSIS — L03012 Cellulitis of left finger: Secondary | ICD-10-CM | POA: Insufficient documentation

## 2018-05-13 MED ORDER — CEPHALEXIN 500 MG PO CAPS
500.0000 mg | ORAL_CAPSULE | Freq: Three times a day (TID) | ORAL | 0 refills | Status: AC
Start: 2018-05-13 — End: 2018-05-20

## 2018-05-13 NOTE — Progress Notes (Signed)
Craig Gutierrez 32 y.o.   Chief Complaint  Patient Craig Gutierrez with  . Hand Pain    LEFT 3rd finger x 2 days-per patient thinks it is infected    HISTORY OF PRESENT ILLNESS: This is a 32 y.o. male complaining of infection to the left middle finger for the past 2 to 3 days.  Denies injury.  HPI   Prior to Admission medications   Medication Sig Start Date End Date Taking? Authorizing Provider  omeprazole (PRILOSEC) 40 MG capsule Take 1 capsule every morning. 12/17/17  Yes Esterwood, Amy S, PA-C  ranitidine (ZANTAC) 300 MG tablet Take 1 tablet (300 mg total) by mouth at bedtime. 12/17/17  Yes Esterwood, Amy S, PA-C  cephALEXin (KEFLEX) 500 MG capsule Take 1 capsule (500 mg total) by mouth 3 (three) times daily for 7 days. 05/13/18 05/20/18  Georgina QuintSagardia, Decorian Schuenemann Jose, MD    No Known Allergies  Patient Active Problem List   Diagnosis Date Noted  . Epigastric pain 11/30/2017  . Heartburn 11/30/2017    Past Medical History:  Diagnosis Date  . Kidney stones     Past Surgical History:  Procedure Laterality Date  . clubfoot Left    infant    Social History   Socioeconomic History  . Marital status: Married    Spouse name: Irving Burtonmily  . Number of children: 1  . Years of education: Not on file  . Highest education level: Not on file  Occupational History  . Occupation: Agricultural consultantleather craftsman  Social Needs  . Financial resource strain: Not on file  . Food insecurity:    Worry: Not on file    Inability: Not on file  . Transportation needs:    Medical: Not on file    Non-medical: Not on file  Tobacco Use  . Smoking status: Never Smoker  . Smokeless tobacco: Never Used  Substance and Sexual Activity  . Alcohol use: Yes    Comment: occ -wine,beer, liquor  . Drug use: No  . Sexual activity: Not on file  Lifestyle  . Physical activity:    Days per week: Not on file    Minutes per session: Not on file  . Stress: Not on file  Relationships  . Social connections:    Talks on phone: Not on  file    Gets together: Not on file    Attends religious service: Not on file    Active member of club or organization: Not on file    Attends meetings of clubs or organizations: Not on file    Relationship status: Not on file  . Intimate partner violence:    Fear of current or ex partner: Not on file    Emotionally abused: Not on file    Physically abused: Not on file    Forced sexual activity: Not on file  Other Topics Concern  . Not on file  Social History Narrative  . Not on file    Family History  Problem Relation Age of Onset  . Diabetes Mother   . Hyperlipidemia Mother   . Irritable bowel syndrome Mother   . Hyperlipidemia Father   . Diabetes Maternal Grandmother   . Hyperlipidemia Maternal Grandmother   . Diabetes Maternal Grandfather   . Hyperlipidemia Maternal Grandfather   . Heart disease Maternal Grandfather   . Colon cancer Paternal Grandmother        dx in her mid 2750's     Review of Systems  Constitutional: Negative.  Negative for chills and fever.  Respiratory: Negative for shortness of breath.   Gastrointestinal: Negative for nausea and vomiting.  Neurological: Negative.  Negative for dizziness, speech change, focal weakness and headaches.  Endo/Heme/Allergies: Negative.   All other systems reviewed and are negative.   Vitals:   05/13/18 1418  BP: 114/72  Pulse: 98  Resp: 16  Temp: 98.2 F (36.8 C)  SpO2: 97%    Physical Exam  Constitutional: He is oriented to person, place, and time. He appears well-developed and well-nourished.  HENT:  Head: Normocephalic and atraumatic.  Eyes: Pupils are equal, round, and reactive to light. EOM are normal.  Neck: Normal range of motion. Neck supple.  Cardiovascular: Normal rate.  Pulmonary/Chest: Effort normal.  Musculoskeletal:  Left middle finger: Positive early paronychia.  Full range of motion.  Good cap refill  Neurological: He is alert and oriented to person, place, and time.  Skin: Skin is warm  and dry. Capillary refill takes less than 2 seconds.  Psychiatric: He has a normal mood and affect. His behavior is normal.  Vitals reviewed.    ASSESSMENT & PLAN: Craig Gutierrez was seen today for hand pain.  Diagnoses and all orders for this visit:  Paronychia of finger of left hand -     cephALEXin (KEFLEX) 500 MG capsule; Take 1 capsule (500 mg total) by mouth 3 (three) times daily for 7 days.    Patient Instructions       IF you received an x-ray today, you will receive an invoice from [facility]. Please contact [facility] at [phone] with questions or concerns regarding your invoice.   IF you received labwork today, you will receive an invoice from [place] [facility]. Please contact [facility] at [phone] with questions or concerns regarding your invoice.   Our billing staff will not be able to assist you with questions regarding bills from these companies.  You will be contacted with the lab results as soon as they are available. The fastest way to get your results is to activate your My Chart account. Instructions are located on the last page of this paperwork. If you have not heard from [place] regarding the results in 2 weeks, please contact this office.     Paronychia Paronychia is an infection of the skin. It happens near a fingernail or toenail. It may cause pain and swelling around the nail. Usually, it is not serious and it clears up with treatment. Follow these instructions at home:  Soak the fingers or toes in warm water as told by your doctor. You may be told to do this for 20 minutes, 2-3 times a day.  Keep the area dry when you are not soaking it.  Take medicines only as told by your doctor.  If you were given an antibiotic medicine, finish all of it even if you start to feel better.  Keep the affected area clean.  Do not try to drain a fluid-filled bump yourself.  Wear rubber gloves when putting your hands in water.  Wear gloves if your hands  might touch cleaners or chemicals.  Follow your doctor's instructions about: ? Wound care. ? Bandage (dressing) changes and removal. Contact a doctor if:  Your symptoms get worse or do not improve.  You have a fever or chills.  You have redness spreading from the affected area.  You have more fluid, blood, or pus coming from the affected area.  Your finger or knuckle is swollen or is hard to move. This information is not intended to replace advice given  to you by your health care provider. Make sure you discuss any questions you have with your health care provider. Document Released: 10/10/2009 Document Revised: 03/29/2016 Document Reviewed: 09/29/2014 Elsevier Interactive Patient Education  2018 Elsevier Inc.      Edwina Barth, MD [facility]

## 2018-05-13 NOTE — Patient Instructions (Addendum)
     IF you received an x-ray today, you will receive an invoice from [facility]. Please contact [facility] at [phone] with questions or concerns regarding your invoice.   IF you received labwork today, you will receive an invoice from [facility]. Please contact [facility] at [phone] with questions or concerns regarding your invoice.   Our billing staff will not be able to assist you with questions regarding bills from these companies.  You will be contacted with the lab results as soon as they are available. The fastest way to get your results is to activate your My Chart account. Instructions are located on the last page of this paperwork. If you have not heard from us regarding the results in 2 weeks, please contact this office.     Paronychia Paronychia is an infection of the skin. It happens near a fingernail or toenail. It may cause pain and swelling around the nail. Usually, it is not serious and it clears up with treatment. Follow these instructions at home:  Soak the fingers or toes in warm water as told by your doctor. You may be told to do this for 20 minutes, 2-3 times a day.  Keep the area dry when you are not soaking it.  Take medicines only as told by your doctor.  If you were given an antibiotic medicine, finish all of it even if you start to feel better.  Keep the affected area clean.  Do not try to drain a fluid-filled bump yourself.  Wear rubber gloves when putting your hands in water.  Wear gloves if your hands might touch cleaners or chemicals.  Follow your doctor's instructions about: ? Wound care. ? Bandage (dressing) changes and removal. Contact a doctor if:  Your symptoms get worse or do not improve.  You have a fever or chills.  You have redness spreading from the affected area.  You have more fluid, blood, or pus coming from the affected area.  Your finger or knuckle is swollen or is hard to move. This information is  not intended to replace advice given to you by your health care provider. Make sure you discuss any questions you have with your health care provider. Document Released: 10/10/2009 Document Revised: 03/29/2016 Document Reviewed: 09/29/2014 Elsevier Interactive Patient Education  2018 Elsevier Inc.  

## 2018-08-14 ENCOUNTER — Ambulatory Visit: Payer: BLUE CROSS/BLUE SHIELD | Admitting: Family Medicine

## 2018-09-08 ENCOUNTER — Encounter (HOSPITAL_COMMUNITY): Payer: Self-pay | Admitting: Emergency Medicine

## 2018-09-08 ENCOUNTER — Emergency Department (HOSPITAL_COMMUNITY)
Admission: EM | Admit: 2018-09-08 | Discharge: 2018-09-08 | Disposition: A | Discharge status: Home / Self Care | Payer: BLUE CROSS/BLUE SHIELD | Attending: Emergency Medicine | Admitting: Emergency Medicine

## 2018-09-08 ENCOUNTER — Ambulatory Visit: Payer: BLUE CROSS/BLUE SHIELD | Admitting: Family Medicine

## 2018-09-08 DIAGNOSIS — G90522 Complex regional pain syndrome I of left lower limb: Secondary | ICD-10-CM | POA: Insufficient documentation

## 2018-09-08 DIAGNOSIS — M545 Low back pain, unspecified: Secondary | ICD-10-CM

## 2018-09-08 DIAGNOSIS — Z79899 Other long term (current) drug therapy: Secondary | ICD-10-CM | POA: Diagnosis not present

## 2018-09-08 DIAGNOSIS — M79605 Pain in left leg: Secondary | ICD-10-CM

## 2018-09-08 MED ORDER — METHOCARBAMOL 500 MG PO TABS: 500.0000 mg | ORAL_TABLET | Freq: Two times a day (BID) | ORAL | 0 refills | Status: AC
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
## 2018-09-08 NOTE — Discharge Instructions (Addendum)
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
## 2018-09-10 ENCOUNTER — Other Ambulatory Visit: Payer: Self-pay

## 2018-09-10 ENCOUNTER — Ambulatory Visit (INDEPENDENT_AMBULATORY_CARE_PROVIDER_SITE_OTHER): Payer: BLUE CROSS/BLUE SHIELD

## 2018-09-10 ENCOUNTER — Ambulatory Visit: Payer: BLUE CROSS/BLUE SHIELD | Admitting: Emergency Medicine

## 2018-09-10 ENCOUNTER — Encounter: Payer: Self-pay | Admitting: Emergency Medicine

## 2018-09-10 VITALS — BP 120/75 | HR 82 | Temp 97.9°F | Resp 16 | Ht 71.0 in | Wt 160.8 lb

## 2018-09-10 DIAGNOSIS — M545 Low back pain, unspecified: Secondary | ICD-10-CM

## 2018-09-10 DIAGNOSIS — G8929 Other chronic pain: Secondary | ICD-10-CM

## 2018-09-10 DIAGNOSIS — M25551 Pain in right hip: Secondary | ICD-10-CM | POA: Diagnosis not present

## 2018-09-10 DIAGNOSIS — M5136 Other intervertebral disc degeneration, lumbar region: Secondary | ICD-10-CM | POA: Diagnosis not present

## 2018-09-10 DIAGNOSIS — Z23 Encounter for immunization: Secondary | ICD-10-CM | POA: Diagnosis not present

## 2018-09-10 NOTE — Patient Instructions (Addendum)
     If you have lab work done today you will be contacted with your lab results within the next 2 weeks.  If you have not heard from us then please contact us. The fastest way to get your results is to register for My Chart.   IF you received an x-ray today, you will receive an invoice from [facility]. Please contact [facility] at [phone] with questions or concerns regarding your invoice.   IF you received labwork today, you will receive an invoice from [facility]. Please contact [facility] at [phone] with questions or concerns regarding your invoice.   Our billing staff will not be able to assist you with questions regarding bills from these companies.  You will be contacted with the lab results as soon as they are available. The fastest way to get your results is to activate your My Chart account. Instructions are located on the last page of this paperwork. If you have not heard from us regarding the results in 2 weeks, please contact this office.      Hip Pain The hip is the joint between the upper legs and the lower pelvis. The bones, cartilage, tendons, and muscles of your hip joint support your body and allow you to move around. Hip pain can range from a minor ache to severe pain in one or both of your hips. The pain may be felt on the inside of the hip joint near the groin, or the outside near the buttocks and upper thigh. You may also have swelling or stiffness. Follow these instructions at home: Managing pain, stiffness, and swelling  If directed, apply ice to the injured area. ? Put ice in a plastic bag. ? Place a towel between your skin and the bag. ? Leave the ice on for 20 minutes, 2-3 times a day  Sleep with a pillow between your legs on your most comfortable side.  Avoid any activities that cause pain. General instructions  Take over-the-counter and prescription medicines only as told by your health care provider.  Do any exercises as told  by your health care provider.  Record the following: ? How often you have hip pain. ? The location of your pain. ? What the pain feels like. ? What makes the pain worse.  Keep all follow-up visits as told by your health care provider. This is important. Contact a health care provider if:  You cannot put weight on your leg.  Your pain or swelling continues or gets worse after one week.  It gets harder to walk.  You have a fever. Get help right away if:  You fall.  You have a sudden increase in pain and swelling in your hip.  Your hip is red or swollen or very tender to touch. Summary  Hip pain can range from a minor ache to severe pain in one or both of your hips.  The pain may be felt on the inside of the hip joint near the groin, or the outside near the buttocks and upper thigh.  Avoid any activities that cause pain.  Record how often you have hip pain, the location of the pain, what makes it worse and what it feels like. This information is not intended to replace advice given to you by your health care provider. Make sure you discuss any questions you have with your health care provider. Document Released: 04/11/2010 Document Revised: 09/24/2016 Document Reviewed: 09/24/2016 Elsevier Interactive Patient Education  2018 Elsevier Inc.  
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
## 2018-09-10 NOTE — Progress Notes (Signed)
mM

## 2018-10-22 IMAGING — US US ABDOMEN COMPLETE
1 series · 14 of 25 positions shown · non-contrast
Comparison: Abdominal CT 08/23/2012

CLINICAL DATA: Epigastric pain for 3 months

EXAM:
ABDOMEN ULTRASOUND COMPLETE

[Series 1: us abdomen complete · 0.20mm/px · 14 of 72 slices shown]
[im 1/72]
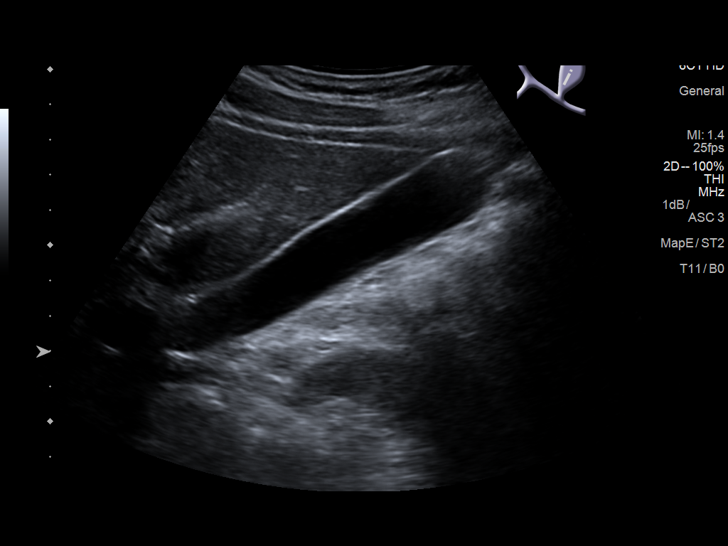
[im 6/72]
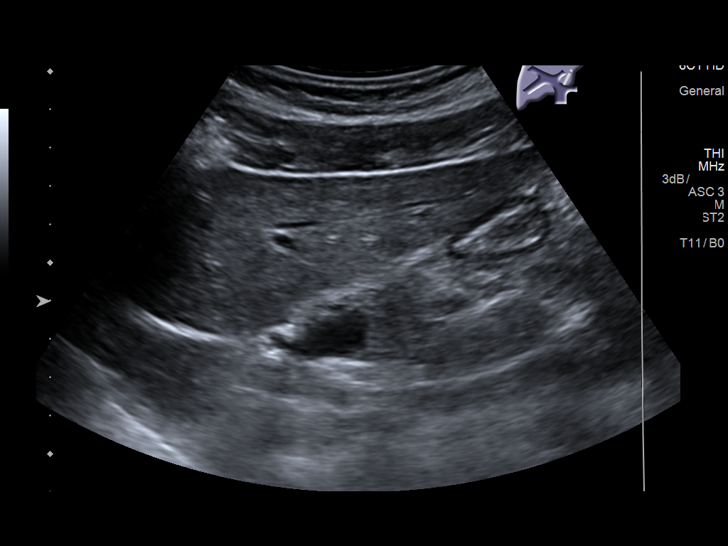
[im 12/72]
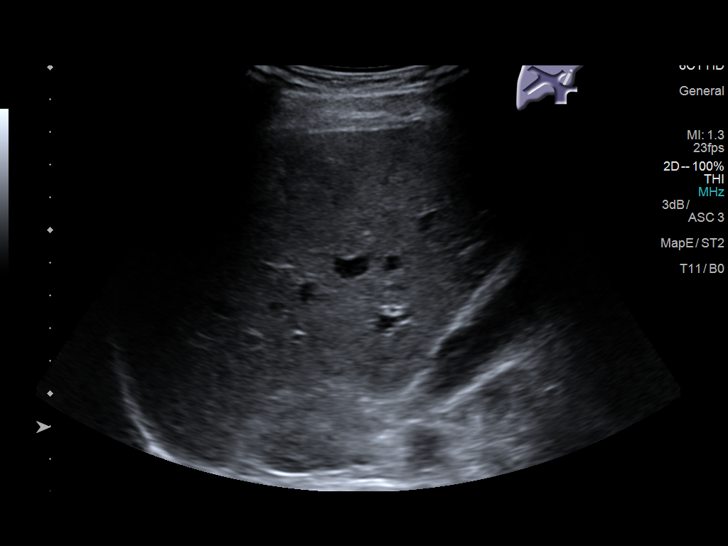
[im 18/72]
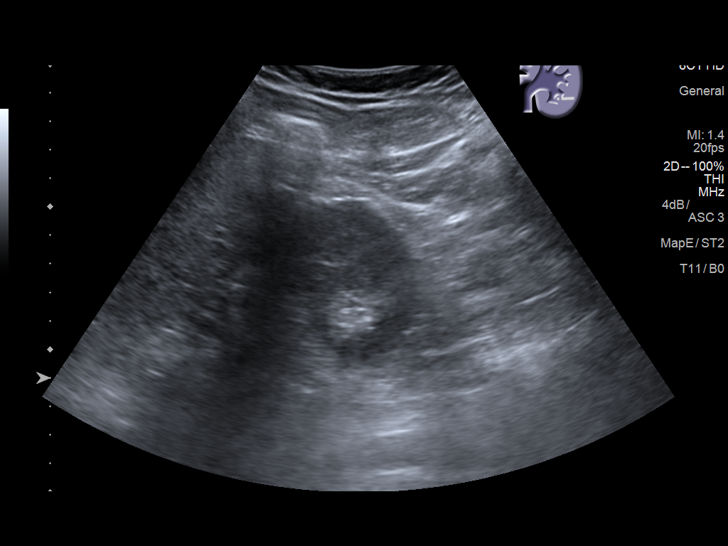
[im 24/72]
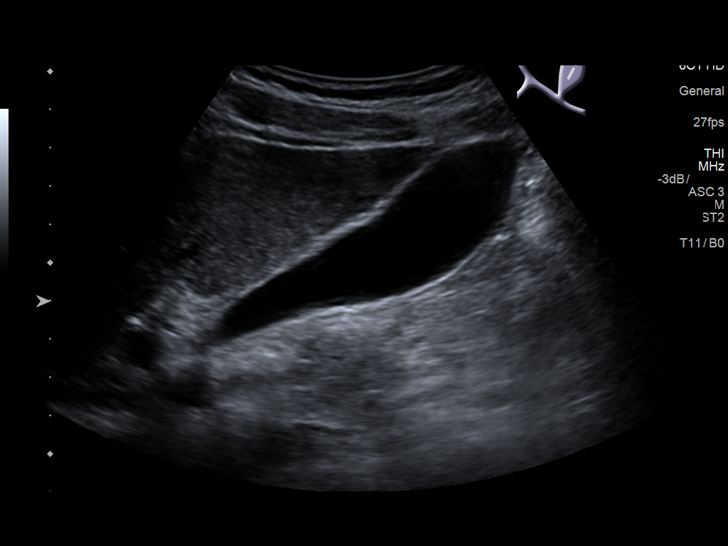
[im 27/72]
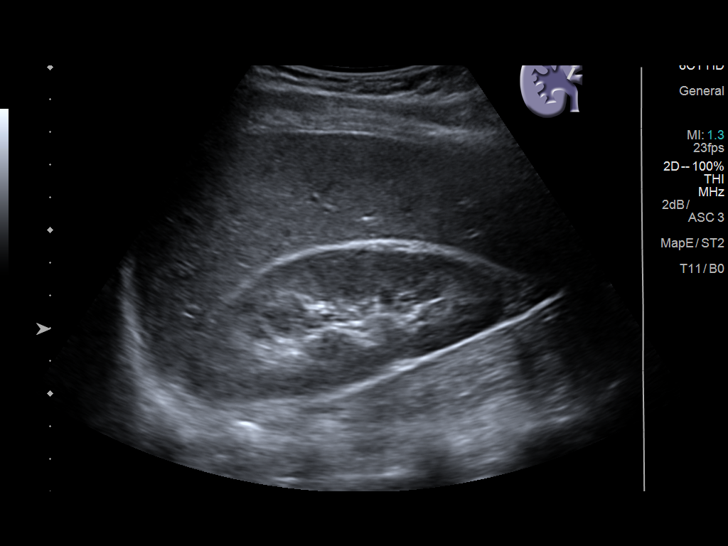
[im 33/72]
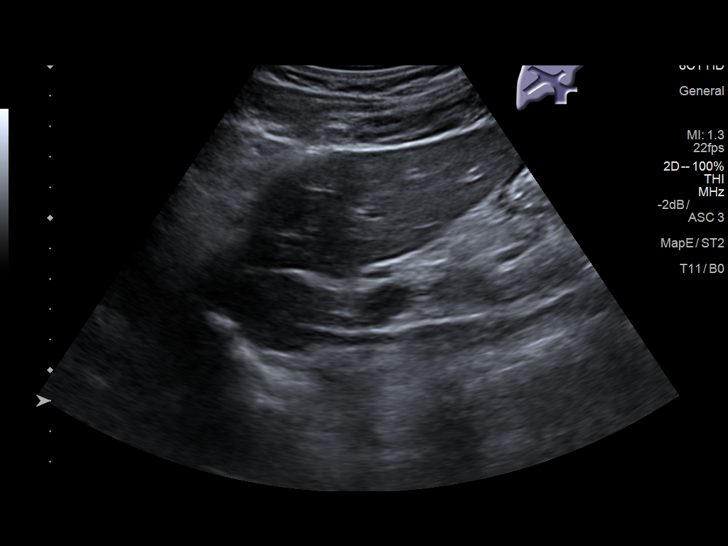
[im 39/72]
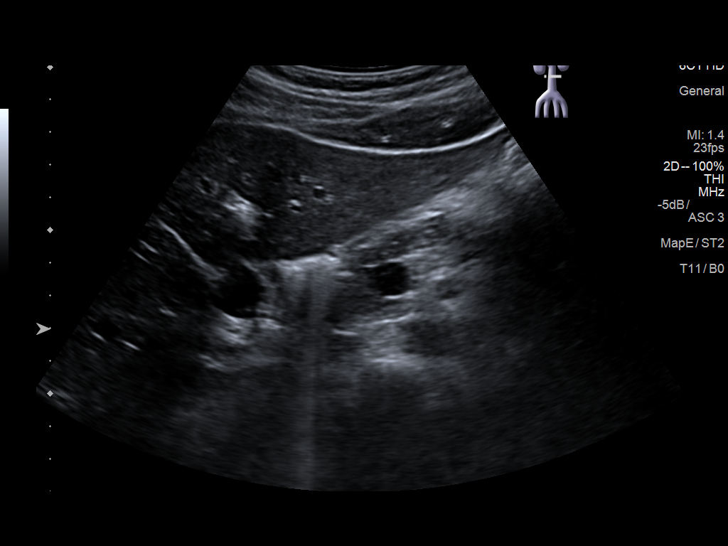
[im 45/72]
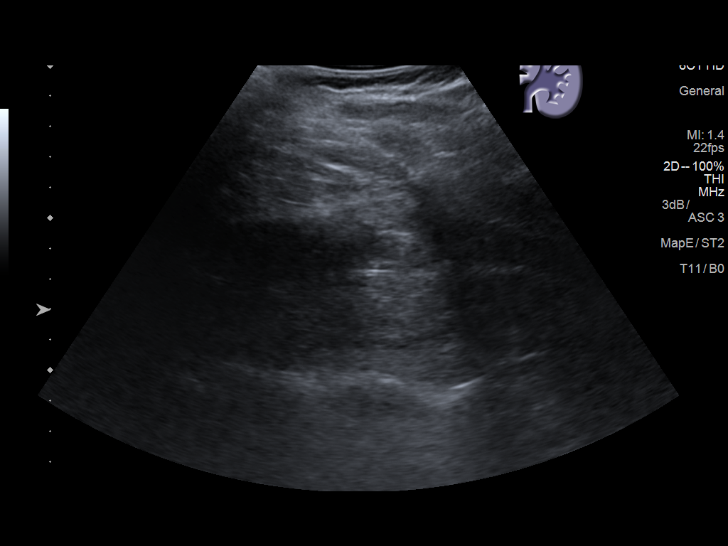
[im 48/72]
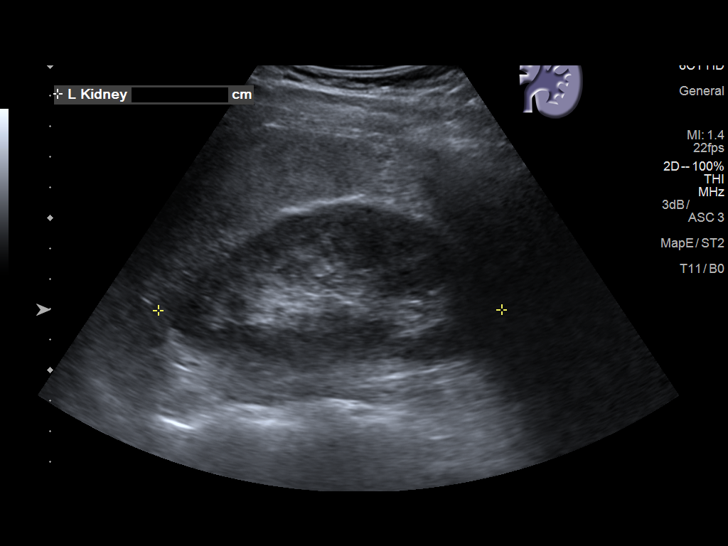
[im 54/72]
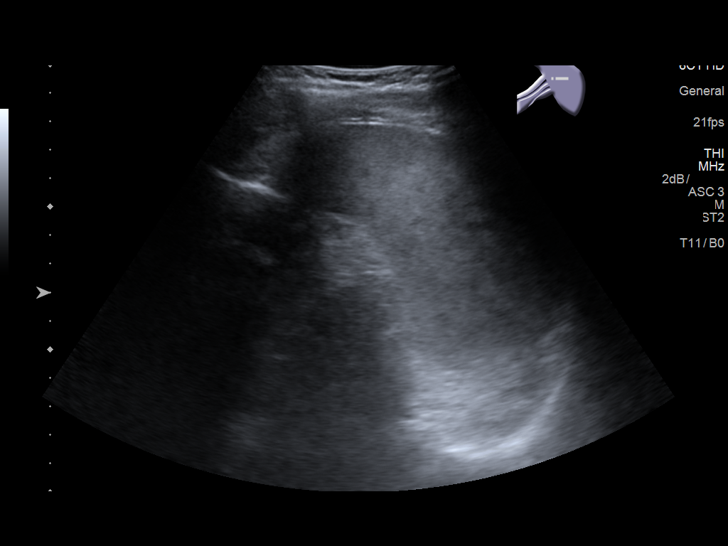
[im 60/72]
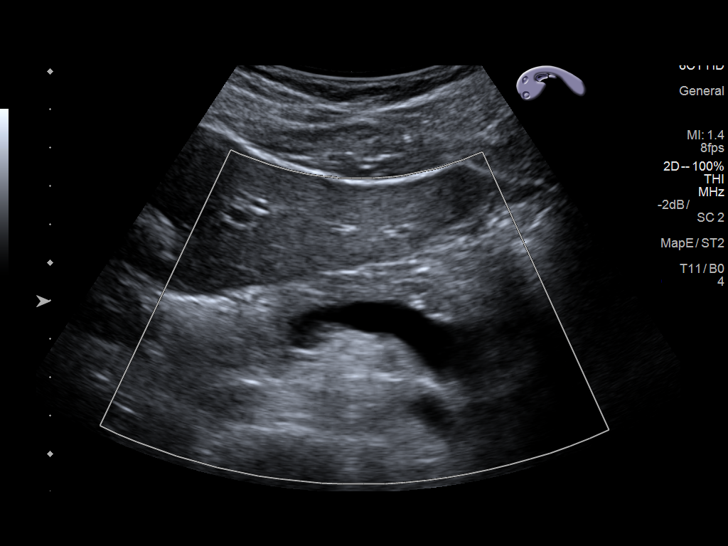
[im 66/72]
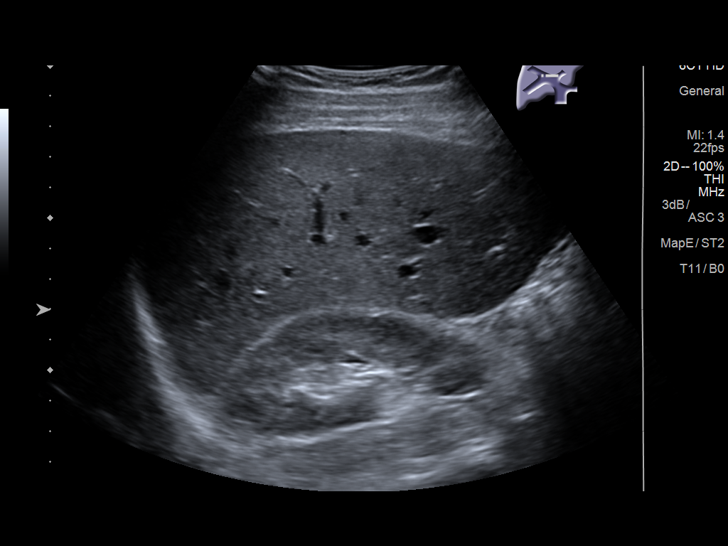
[im 72/72]
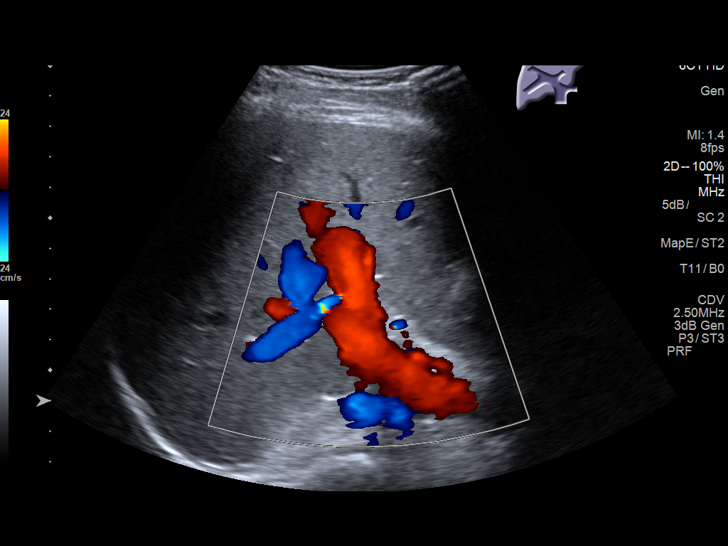

[14 of 25 positions shown; findings below may reference images not displayed]

FINDINGS: Gallbladder: No gallstones or wall thickening visualized. No
sonographic Murphy sign noted by sonographer.

Common bile duct: Diameter: 2 mm

Liver: No focal lesion identified. Within normal limits in
parenchymal echogenicity. Portal vein is patent on color Doppler
imaging with normal direction of blood flow towards the liver.

IVC: No abnormality visualized.

Pancreas: Visualized portion unremarkable.

Spleen: Normal size. Mild heterogeneity without discrete mass
lesion.

Right Kidney: Length: 11 cm. Echogenicity within normal limits. No
mass or hydronephrosis visualized.

Left Kidney: Length: 11 cm. Echogenicity within normal limits. No
mass or hydronephrosis visualized.

Abdominal aorta: Both proximal and distal aorta is obscured by bowel
gas. The visualized segment is normal.
IMPRESSION: Negative abdominal ultrasound.  No explanation for pain.

## 2019-05-05 DIAGNOSIS — Z3009 Encounter for other general counseling and advice on contraception: Secondary | ICD-10-CM | POA: Diagnosis not present

## 2019-06-08 DIAGNOSIS — Z302 Encounter for sterilization: Secondary | ICD-10-CM | POA: Diagnosis not present

## 2019-07-29 DIAGNOSIS — Z20828 Contact with and (suspected) exposure to other viral communicable diseases: Secondary | ICD-10-CM | POA: Diagnosis not present

## 2020-02-22 IMAGING — DX DG HIP (WITH OR WITHOUT PELVIS) 2-3V*R*
3 series · 3 of 3 positions shown · non-contrast
Comparison: None

CLINICAL DATA: RIGHT hip pain

EXAM:
DG HIP (WITH OR WITHOUT PELVIS) 2-3V RIGHT

[pelvis ap]
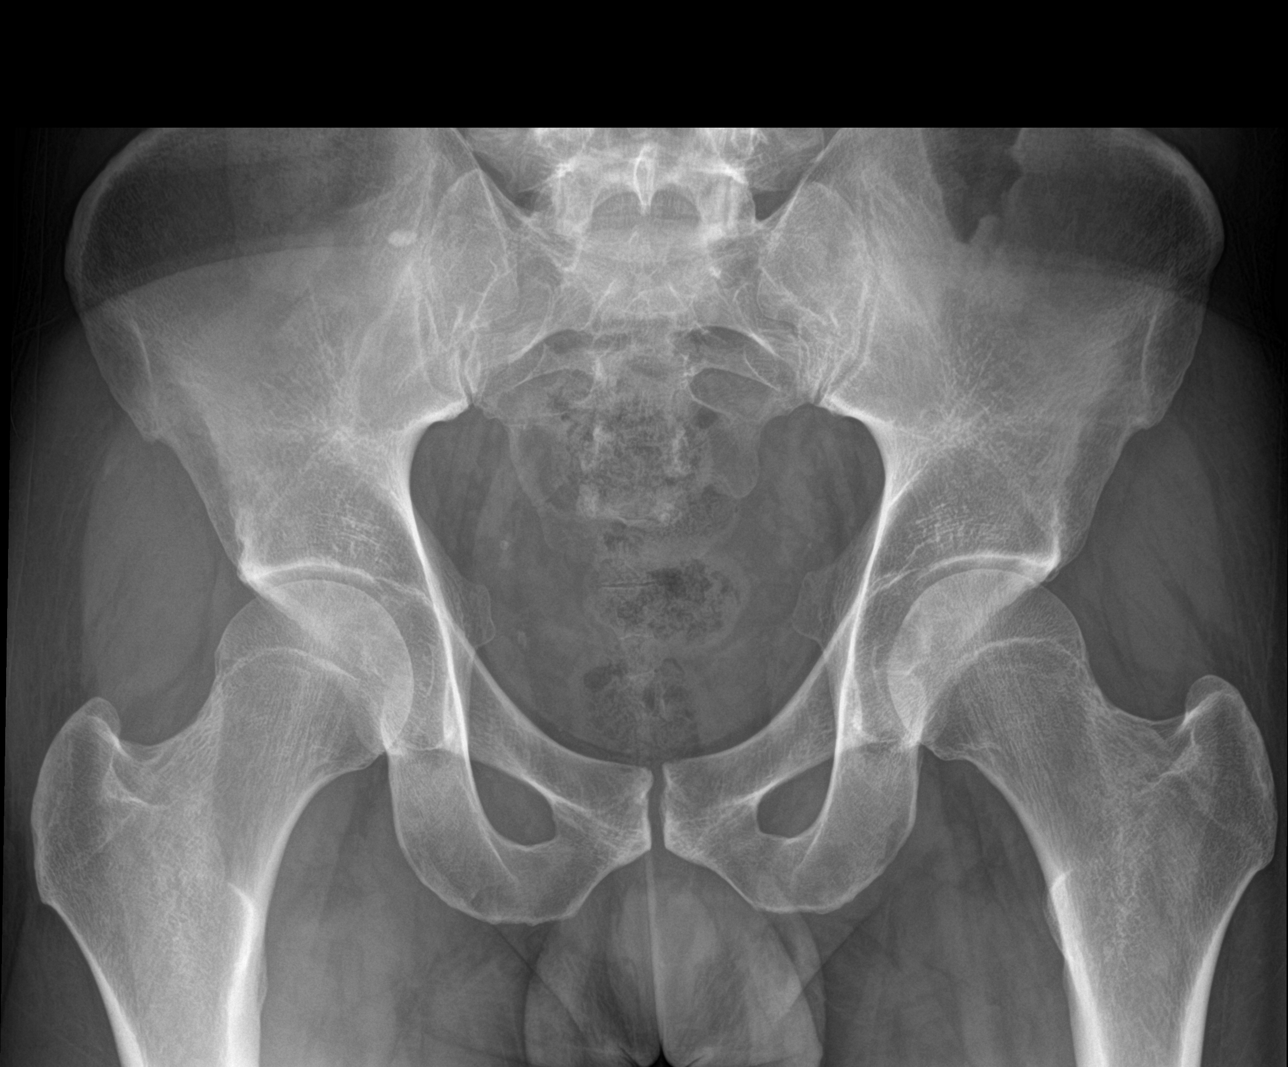

[hip ap]
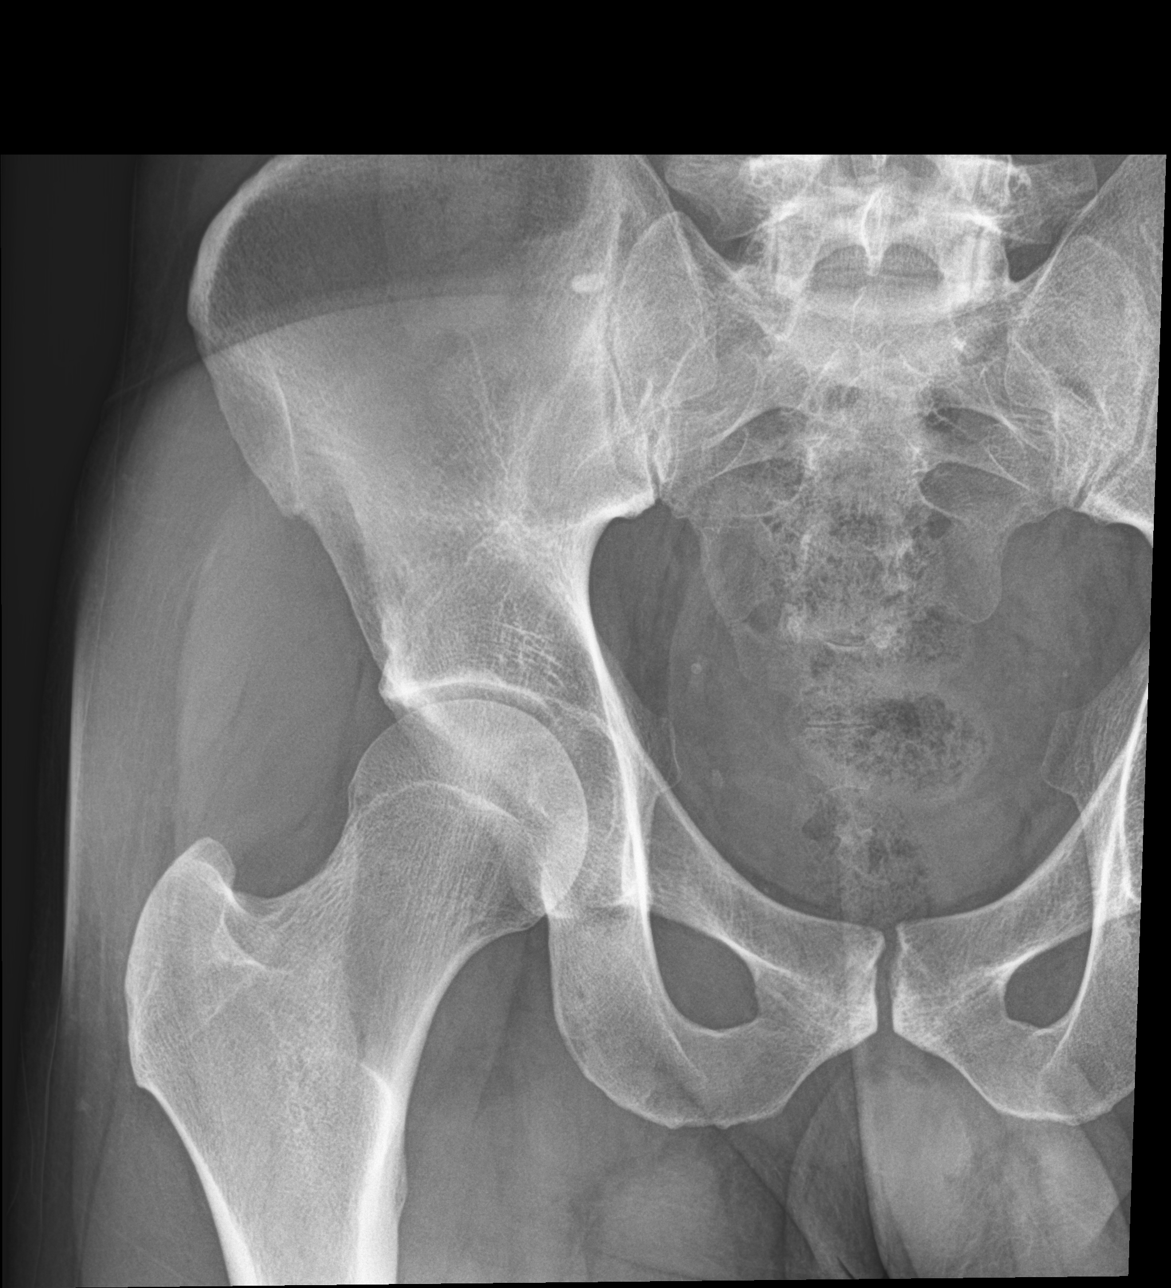

[hip lat]
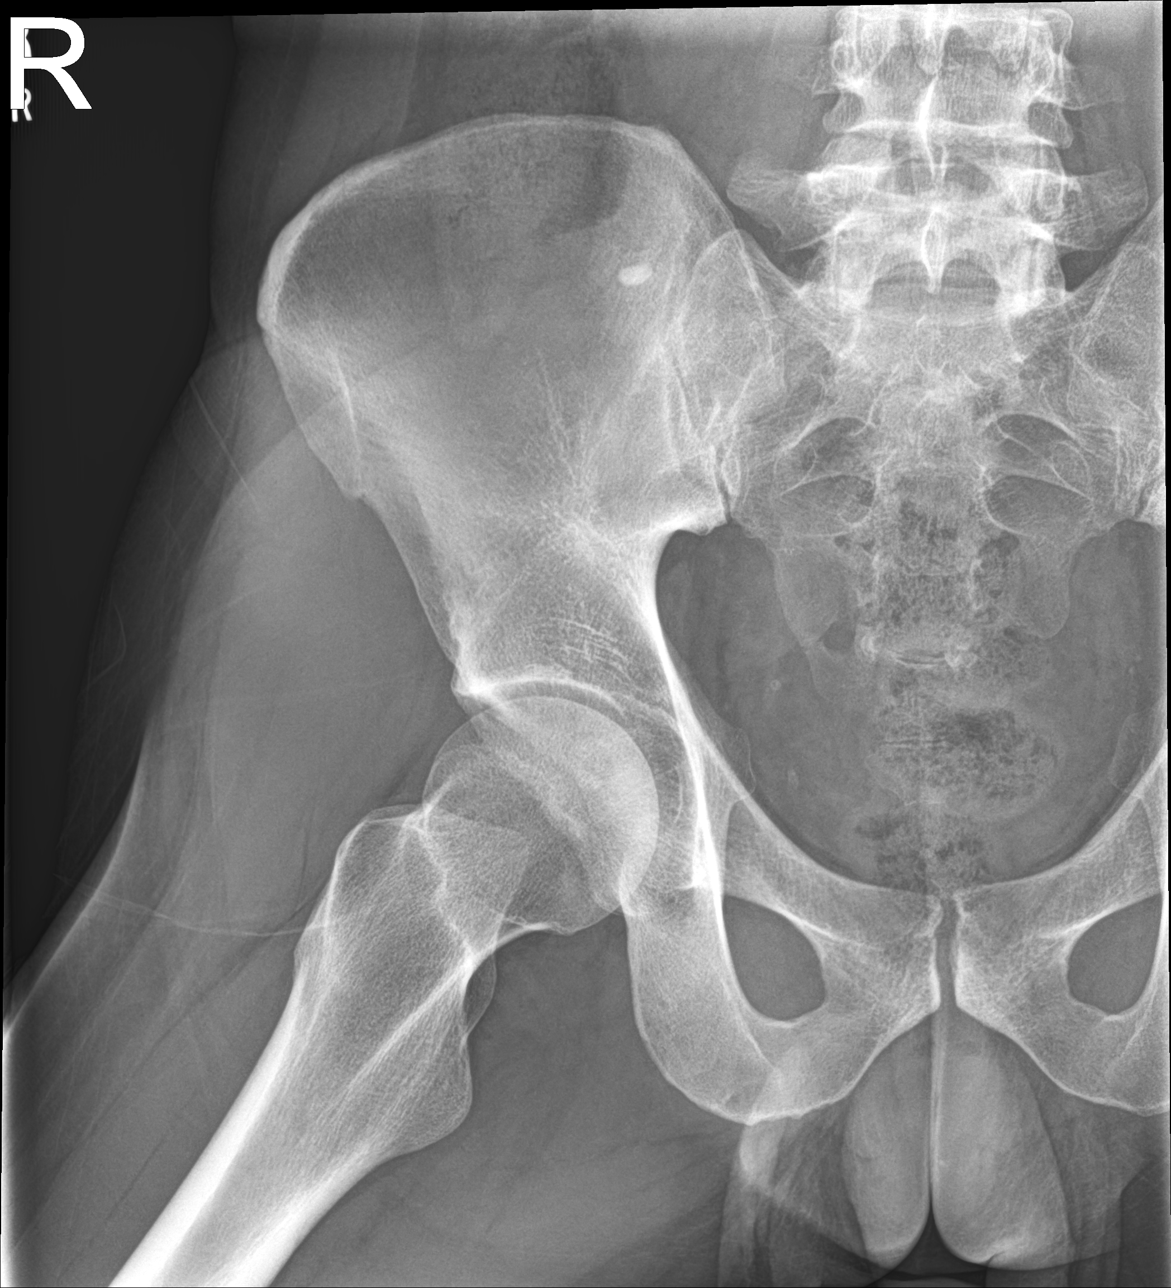

[3 of 3 positions shown; findings below may reference images not displayed]

FINDINGS: Osseous mineralization normal for technique.

Hip and SI joint spaces preserved.

No acute fracture, dislocation, or bone destruction.

Small ovoid calcification projects over the RIGHT iliac bone,
question superimposed phleboliths versus small bone island.
IMPRESSION: No acute osseous abnormalities.
